# Patient Record
Sex: Male | Born: 2000 | Race: White | Hispanic: No | Marital: Single | Smoking: Never smoker
Health system: Southern US, Community
[De-identification: ages and names within clinical notes are randomized; demographics above are authoritative.]

## PROBLEM LIST (undated history)

## (undated) DIAGNOSIS — Q998 Other specified chromosome abnormalities: Secondary | ICD-10-CM

## (undated) DIAGNOSIS — F840 Autistic disorder: Secondary | ICD-10-CM

## (undated) DIAGNOSIS — J45909 Unspecified asthma, uncomplicated: Secondary | ICD-10-CM

## (undated) DIAGNOSIS — R569 Unspecified convulsions: Secondary | ICD-10-CM

## (undated) DIAGNOSIS — T7840XA Allergy, unspecified, initial encounter: Secondary | ICD-10-CM

## (undated) DIAGNOSIS — F99 Mental disorder, not otherwise specified: Secondary | ICD-10-CM

## (undated) DIAGNOSIS — Q922 Partial trisomy: Secondary | ICD-10-CM

## (undated) DIAGNOSIS — F909 Attention-deficit hyperactivity disorder, unspecified type: Secondary | ICD-10-CM

## (undated) DIAGNOSIS — F952 Tourette's disorder: Secondary | ICD-10-CM

## (undated) DIAGNOSIS — F419 Anxiety disorder, unspecified: Secondary | ICD-10-CM

## (undated) DIAGNOSIS — R51 Headache: Secondary | ICD-10-CM

## (undated) DIAGNOSIS — H539 Unspecified visual disturbance: Secondary | ICD-10-CM

## (undated) DIAGNOSIS — E669 Obesity, unspecified: Secondary | ICD-10-CM

## (undated) DIAGNOSIS — L858 Other specified epidermal thickening: Secondary | ICD-10-CM

## (undated) HISTORY — PX: EAR TUBE REMOVAL: SHX1486

---
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
## 2022-06-08 ENCOUNTER — Emergency Department
Admission: EM | Admit: 2022-06-08 | Discharge: 2022-06-08 | Disposition: A | Discharge status: Home / Self Care | Payer: Medicaid Other | Attending: Emergency Medicine | Admitting: Emergency Medicine

## 2022-06-08 ENCOUNTER — Other Ambulatory Visit: Payer: Self-pay

## 2022-06-08 DIAGNOSIS — R059 Cough, unspecified: Secondary | ICD-10-CM | POA: Insufficient documentation

## 2022-06-08 DIAGNOSIS — R42 Dizziness and giddiness: Secondary | ICD-10-CM | POA: Insufficient documentation

## 2022-06-08 DIAGNOSIS — R112 Nausea with vomiting, unspecified: Secondary | ICD-10-CM | POA: Diagnosis present

## 2022-06-08 LAB — URINALYSIS, ROUTINE W REFLEX MICROSCOPIC
Bilirubin Urine: NEGATIVE
Glucose, UA: NEGATIVE mg/dL
Hgb urine dipstick: NEGATIVE
Ketones, ur: NEGATIVE mg/dL
Leukocytes,Ua: NEGATIVE
Nitrite: NEGATIVE
Protein, ur: 30 mg/dL — AB
Specific Gravity, Urine: 1.028 (ref 1.005–1.030)
pH: 6 (ref 5.0–8.0)

## 2022-06-08 LAB — CBC
HCT: 43.2 % (ref 39.0–52.0)
Hemoglobin: 14.1 g/dL (ref 13.0–17.0)
MCH: 27.5 pg (ref 26.0–34.0)
MCHC: 32.6 g/dL (ref 30.0–36.0)
MCV: 84.4 fL (ref 80.0–100.0)
Platelets: 361 10*3/uL (ref 150–400)
RBC: 5.12 MIL/uL (ref 4.22–5.81)
RDW: 14.2 % (ref 11.5–15.5)
WBC: 6.5 10*3/uL (ref 4.0–10.5)
nRBC: 0 % (ref 0.0–0.2)

## 2022-06-08 LAB — BASIC METABOLIC PANEL
Anion gap: 11 (ref 5–15)
BUN: 15 mg/dL (ref 6–20)
CO2: 24 mmol/L (ref 22–32)
Calcium: 9.5 mg/dL (ref 8.9–10.3)
Chloride: 100 mmol/L (ref 98–111)
Creatinine, Ser: 0.86 mg/dL (ref 0.61–1.24)
GFR, Estimated: >60 mL/min (ref >60)
Glucose, Bld: 102 mg/dL — ABNORMAL HIGH (ref 70–99)
Potassium: 4.1 mmol/L (ref 3.5–5.1)
Sodium: 135 mmol/L (ref 135–145)

## 2022-06-08 LAB — CBG MONITORING, ED: Glucose-Capillary: 100 mg/dL — ABNORMAL HIGH (ref 70–99)

## 2022-06-08 NOTE — ED Triage Notes (Signed)
EMS called to [facility] for nose bleed.  Arrives to [facility], state that bleeding had resolved upon arrival.  Patient presents for  c/o dizziness.  VS wnl.

## 2022-06-08 NOTE — ED Notes (Signed)
Spoke to [employer] at group home, ride is confirmed.

## 2022-06-08 NOTE — ED Provider Notes (Signed)
[facility] Provider Note    Event Date/Time   First MD Initiated Contact with Patient 06/08/22 1135     (approximate)   History   Nausea and dizziness   HPI  Steven Aguilar is a 22 y.o. male with a history of autism who presents for evaluation after an episode of nausea and dizziness.  He reports that he felt nauseated after having a bloody nose, he coughed up a bloody clot and felt nauseated and slightly dizzy.  This lasted approximately 1 minute, he feels much better, he did call EMS for this.  No palpitations no other complaints at this time     Physical Exam   Triage Vital Signs: ED Triage Vitals  Enc Vitals Group     BP 06/08/22 1125 (!) 128/97     Pulse Rate 06/08/22 1125 87     Resp 06/08/22 1125 18     Temp 06/08/22 1125 98.4 F (36.9 C)     Temp Source 06/08/22 1125 Oral     SpO2 06/08/22 1125 100 %     Weight 06/08/22 1106 104.3 kg (229 lb 15 oz)     Height 06/08/22 1106 1.778 m (5\' 10" )     Head Circumference --      Peak Flow --      Pain Score 06/08/22 1106 0     Pain Loc --      Pain Edu? --      Excl. in [place]? --     Most recent vital signs: Vitals:   06/08/22 1125  BP: (!) 128/97  Pulse: 87  Resp: 18  Temp: 98.4 F (36.9 C)  SpO2: 100%     General: Awake, no distress.  CV:  Good peripheral perfusion.  Regular rate and rhythm, no murmur Resp:  Normal effort.  Abd:  No distention.  Other:     ED Results / Procedures / Treatments   Labs (all labs ordered are listed, but only abnormal results are displayed) Labs Reviewed  BASIC METABOLIC PANEL - Abnormal; Notable for the following components:      Result Value   Glucose, Bld 102 (*)    All other components within normal limits  URINALYSIS, ROUTINE W REFLEX MICROSCOPIC - Abnormal; Notable for the following components:   Color, Urine YELLOW (*)    APPearance HAZY (*)    Protein, ur 30 (*)    Bacteria, UA RARE (*)    All other components within normal  limits  CBG MONITORING, ED - Abnormal; Notable for the following components:   Glucose-Capillary 100 (*)    All other components within normal limits  CBC     EKG ED ECG REPORT I, Lavonia Drafts, the attending physician, personally viewed and interpreted this ECG.  Date: 06/08/2022  Rhythm: normal sinus rhythm QRS Axis: normal Intervals: Nonspecific changes ST/T Wave abnormalities: normal Narrative Interpretation: no evidence of acute ischemia     RADIOLOGY     PROCEDURES:  Critical Care performed:   Procedures   MEDICATIONS ORDERED IN ED: Medications - No data to display   IMPRESSION / MDM / [place] / ED COURSE  I reviewed the triage vital signs and the nursing notes. Patient's presentation is most consistent with acute illness / injury with system symptoms.  Patient presents with nausea and dizziness as detailed above, I suspect this was a reaction to coughing up blood clot from epistaxis.  Lab work reviewed and is reassuring, normal platelets, normal  hemoglobin, he is asymptomatic and well-appearing at this time, no indication for further workup        FINAL CLINICAL IMPRESSION(S) / ED DIAGNOSES   Final diagnoses:  Nausea and vomiting, unspecified vomiting type     Rx / DC Orders   ED Discharge Orders     None        Note:  This document was prepared using Dragon voice recognition software and may include unintentional dictation errors.   Lavonia Drafts, MD 06/08/22 1440

## 2022-11-14 ENCOUNTER — Emergency Department
Admission: EM | Admit: 2022-11-14 | Discharge: 2022-11-15 | Disposition: A | Discharge status: Home / Self Care | Payer: MEDICAID | Attending: Emergency Medicine | Admitting: Emergency Medicine

## 2022-11-14 ENCOUNTER — Other Ambulatory Visit: Payer: Self-pay

## 2022-11-14 DIAGNOSIS — F840 Autistic disorder: Secondary | ICD-10-CM | POA: Diagnosis not present

## 2022-11-14 DIAGNOSIS — J45909 Unspecified asthma, uncomplicated: Secondary | ICD-10-CM | POA: Insufficient documentation

## 2022-11-14 DIAGNOSIS — F902 Attention-deficit hyperactivity disorder, combined type: Secondary | ICD-10-CM | POA: Diagnosis not present

## 2022-11-14 DIAGNOSIS — F4310 Post-traumatic stress disorder, unspecified: Secondary | ICD-10-CM | POA: Insufficient documentation

## 2022-11-14 DIAGNOSIS — R45850 Homicidal ideations: Secondary | ICD-10-CM | POA: Diagnosis not present

## 2022-11-14 DIAGNOSIS — F849 Pervasive developmental disorder, unspecified: Secondary | ICD-10-CM | POA: Diagnosis not present

## 2022-11-14 DIAGNOSIS — R4689 Other symptoms and signs involving appearance and behavior: Secondary | ICD-10-CM | POA: Diagnosis present

## 2022-11-14 LAB — URINE DRUG SCREEN, QUALITATIVE (ARMC ONLY)
Amphetamines, Ur Screen: NOT DETECTED
Barbiturates, Ur Screen: NOT DETECTED
Benzodiazepine, Ur Scrn: NOT DETECTED
Cannabinoid 50 Ng, Ur ~~LOC~~: NOT DETECTED
Cocaine Metabolite,Ur ~~LOC~~: NOT DETECTED
MDMA (Ecstasy)Ur Screen: NOT DETECTED
Methadone Scn, Ur: NOT DETECTED
Opiate, Ur Screen: NOT DETECTED
Phencyclidine (PCP) Ur S: NOT DETECTED
Tricyclic, Ur Screen: NOT DETECTED

## 2022-11-14 LAB — COMPREHENSIVE METABOLIC PANEL
ALT: 40 U/L (ref 0–44)
AST: 23 U/L (ref 15–41)
Albumin: 4.9 g/dL (ref 3.5–5.0)
Alkaline Phosphatase: 66 U/L (ref 38–126)
Anion gap: 10 (ref 5–15)
BUN: 16 mg/dL (ref 6–20)
CO2: 24 mmol/L (ref 22–32)
Calcium: 9.6 mg/dL (ref 8.9–10.3)
Chloride: 100 mmol/L (ref 98–111)
Creatinine, Ser: 0.97 mg/dL (ref 0.61–1.24)
GFR, Estimated: >60 mL/min (ref >60)
Glucose, Bld: 96 mg/dL (ref 70–99)
Potassium: 3.8 mmol/L (ref 3.5–5.1)
Sodium: 134 mmol/L — ABNORMAL LOW (ref 135–145)
Total Bilirubin: 0.7 mg/dL (ref 0.3–1.2)
Total Protein: 9.1 g/dL — ABNORMAL HIGH (ref 6.5–8.1)

## 2022-11-14 LAB — CBC
HCT: 43.9 % (ref 39.0–52.0)
Hemoglobin: 14 g/dL (ref 13.0–17.0)
MCH: 27.6 pg (ref 26.0–34.0)
MCHC: 31.9 g/dL (ref 30.0–36.0)
MCV: 86.4 fL (ref 80.0–100.0)
Platelets: 352 10*3/uL (ref 150–400)
RBC: 5.08 MIL/uL (ref 4.22–5.81)
RDW: 14.6 % (ref 11.5–15.5)
WBC: 7.9 10*3/uL (ref 4.0–10.5)
nRBC: 0 % (ref 0.0–0.2)

## 2022-11-14 LAB — SALICYLATE LEVEL: Salicylate Lvl: <7 mg/dL — ABNORMAL LOW (ref 7.0–30.0)

## 2022-11-14 LAB — ACETAMINOPHEN LEVEL: Acetaminophen (Tylenol), Serum: <10 ug/mL — ABNORMAL LOW (ref 10–30)

## 2022-11-14 LAB — ETHANOL: Alcohol, Ethyl (B): <10 mg/dL (ref <10)

## 2022-11-14 MED ORDER — LORAZEPAM 1 MG PO TABS: 1.0000 mg | ORAL_TABLET | ORAL | Status: DC | PRN

## 2022-11-14 MED ORDER — LORAZEPAM 1 MG PO TABS
1.0000 mg | ORAL_TABLET | Freq: Once | ORAL | Status: AC
  Administered 2022-11-14: 1 mg via ORAL
  Filled 2022-11-14: qty 1

## 2022-11-14 NOTE — BH Assessment (Signed)
Comprehensive Clinical Assessment (CCA) Screening, Triage and Referral Note  11/14/2022 Steven Aguilar [id number] recommended pt. be observed overnight and discharged back to the group home in the morning.  [name] is a 22 y.o., Caucasian, Not Hispanic or Latino ethnicity, ENGLISH speaking male with a history of ADHD, PDD, ODD, MDD with psychotic features, anxiety, autism, and chronic motor tic who presented to the ED under IVC.  Per triage note: Pt arrives with BPD with IVC papers from pt group home, Just in time. Per Respondent, pt is dx with autism and was calling his previous employer and threatening to kill all of the. Pt is very talkative in triage with staff and calm.  Since arrival to the ED, the patient has been anxious but cooperative. Pt presented with a anxious mood and a congruent affect. Pt was engaged, but fixated on returning to the group home throughout the assessment. Pt's speech was clear and coherent. Pt was able to identify why he'd presented to the ED and admitted that he threatened people from his old job at [employer] to Go because they won't give him his job back. Pt had lacking insight and judgment. Pt presented with relevant thought processes and normal psychomotor activity. The patient denied current SI, HI or AV/H. BAL/UDS is unremarkable. Chief Complaint:  Chief Complaint  Patient presents with   Homicidal   Visit Diagnosis: Homicidal ideation Active Problems:   Behavior concern   PTSD (post-traumatic stress disorder)   ADHD (attention deficit hyperactivity disorder), combined type   PDD (pervasive developmental disorder), active   Patient Reported Information How did you hear about [place]? No data recorded What Is the Reason for Your Visit/Call Today? No data recorded How Long Has This Been Causing You Problems? No data recorded What Do You Feel Would Help You the Most Today? No data recorded  Have You Recently Had Any Thoughts About Hurting Yourself?  No data recorded Are You Planning to Commit Suicide/Harm Yourself At This time? No data recorded  Have you Recently Had Thoughts About Hurting Someone [name]? No data recorded Are You Planning to Harm Someone at This Time? No data recorded Explanation: No data recorded  Have You Used Any Alcohol or Drugs in the Past 24 Hours? No data recorded How Long Ago Did You Use Drugs or Alcohol? No data recorded What Did You Use and How Much? No data recorded  Do You Currently Have a Therapist/Psychiatrist? No data recorded Name of Therapist/Psychiatrist: No data recorded  Have You Been Recently Discharged From Any Office Practice or Programs? No data recorded Explanation of Discharge From Practice/Program: No data recorded   CCA Screening Triage Referral Assessment Type of Contact: No data recorded Telemedicine Service Delivery:   Is this Initial or Reassessment?   Date Telepsych consult ordered in [facility]:    Time Telepsych consult ordered in [facility]:    Location of Assessment: No data recorded Provider Location: No data recorded   Collateral Involvement: No data recorded  Does Patient Have a Court Appointed Legal Guardian? No data recorded Name and Contact of Legal Guardian: No data recorded If Minor and Not Living with Parent(s), Who has Custody? No data recorded Is CPS involved or ever been involved? No data recorded Is APS involved or ever been involved? No data recorded  Patient Determined To Be At Risk for Harm To Self or Others Based on Review of Patient Reported Information or Presenting Complaint? No data recorded Method: No data recorded Availability of Means: No data  recorded Intent: No data recorded Notification Required: No data recorded Additional Information for Danger to Others Potential: No data recorded Additional Comments for Danger to Others Potential: No data recorded Are There Guns or Other Weapons in Your Home? No data recorded Types of Guns/Weapons: No data  recorded Are These Weapons Safely Secured?                            No data recorded Who Could Verify You Are Able To Have These Secured: No data recorded Do You Have any Outstanding Charges, Pending Court Dates, Parole/Probation? No data recorded Contacted To Inform of Risk of Harm To Self or Others: No data recorded  Does Patient Present under Involuntary Commitment? No data recorded   [place] of Residence: No data recorded  Patient Currently Receiving the Following Services: No data recorded  Determination of Need: No data recorded  Options For Referral: No data recorded  Discharge Disposition:     Aleenah Homen R Izabela Ow, LCAS

## 2022-11-14 NOTE — ED Notes (Signed)
Pt. To BHU from ED ambulatory without difficulty, to room  BHU 3. Pt. Is alert and oriented, warm and dry in no distress. Pt. Denies SI, HI, and AVH. Pt. Calm and cooperative. Pt states I am going home here soon. Pt. Made aware of security cameras and Q15 minute rounds. Pt. Encouraged to let Nursing staff know of any concerns or needs.    ENVIRONMENTAL ASSESSMENT Potentially harmful objects out of patient reach: Yes.   Personal belongings secured: Yes.   Patient dressed in hospital provided attire only: Yes.   Plastic bags out of patient reach: Yes.   Patient care equipment (cords, cables, call bells, lines, and drains) shortened, removed, or accounted for: Yes.   Equipment and supplies removed from bottom of stretcher: Yes.   Potentially toxic materials out of patient reach: Yes.   Sharps container removed or out of patient reach: Yes.

## 2022-11-14 NOTE — ED Notes (Signed)
Patient complaining of anxiety and states, "I got to get out of here."

## 2022-11-14 NOTE — Consult Note (Signed)
Telepsych Consultation   Reason for Consult:  psych evaluation Referring Physician:  Dr. Larinda Buttery Location of Patient:  Location of Provider: Other: Remote   Patient Identification: Steven Aguilar MRN:  [id number] Principal Diagnosis: Homicidal ideation Diagnosis:  Principal Problem:   Homicidal ideation Active Problems:   Behavior concern   PTSD (post-traumatic stress disorder)   ADHD (attention deficit hyperactivity disorder), combined type   PDD (pervasive developmental disorder), active   Total Time spent with patient: 45 minutes  Subjective:   Steven Aguilar is a 22 y.o. male patient admitted with threats to his former employee.  HPI:  Tele psych Assessment Patient location:  [facility] ER Provider location:  Remote office  [name], 22 y.o., male patient seen via tele health by TTS and this provider; chart reviewed and consulted with Dr. Larinda Buttery on 11/14/22. Per triage note,  Pt arrives with BPD with IVC papers from pt group home, Just in time. Per Respondent, pt is dx with autism and was calling his previous employer and threatening to kill all of the. Pt is very talkative in triage with staff and calm.    During evaluation Steven Aguilar is Laying in bed he is alert/oriented x 4; anxious/cooperative; and mood congruent with affect.   [name] reports he was angry and wanted his job back at the [employer].  When they wouldn't re-employ him, he called them and threatened to kill them.  He admits that he said that out of anger and is currently expressing remorse having said that.  He states that he has no access to weapons or anything that can cause harm to another.   Patient is speaking in a clear tone at moderate volume, and normal pace; with good eye contact. His thought process is coherent and relevant; There is no indication that he is currently responding to internal/external stimuli or experiencing delusional thought content.   Patient denies suicidal/self-harm/homicidal ideation, psychosis, and paranoia. Patient is anxious but has remained calm throughout assessment and has answered questions appropriately.     Recommendations: Discharge back to group home in the am if patient remains psychiatrically stable  Past Psychiatric History: Autism, PTSD, RDD, Chronic motor tic disorder  Risk to Self:   Risk to Others:   Prior Inpatient Therapy:   Prior Outpatient Therapy:    Past Medical History:  Past Medical History:  Diagnosis Date   ADHD (attention deficit hyperactivity disorder)    Allergy    Anxiety    Asthma    Autism    Duplication of chromosome 7p    Headache(784.0)    Keratosis pilaris    uses eucerin at times   Mental disorder    Obesity    Seizure (HCC)    Tourette's    Vision abnormalities    reading glasses    Past Surgical History:  Procedure Laterality Date   EAR TUBE REMOVAL     Family History: No family history on file. Family Psychiatric  History:  Social History:  Social History   Substance and Sexual Activity  Alcohol Use Never     Social History   Substance and Sexual Activity  Drug Use No    Social History   Socioeconomic History   Marital status: Single    Spouse name: Not on file   Number of children: Not on file   Years of education: Not on file   Highest education level: Not on file  Occupational History   Occupation: Consulting civil engineer  Comment: [employer], [employer]  Tobacco Use   Smoking status: Never   Smokeless tobacco: Never  Vaping Use   Vaping status: Never Used  Substance and Sexual Activity   Alcohol use: Never   Drug use: No   Sexual activity: Never  Other Topics Concern   Not on file  Social History Narrative   ** Merged History Encounter **            Social Determinants of Health   Financial Resource Strain: Not on file  Food Insecurity: Not on file  Transportation Needs: Not on file  Physical Activity: Not on file  Stress: Not on  file  Social Connections: Not on file   Additional Social History:    Allergies:   Allergies  Allergen Reactions   Latuda [Lurasidone Hcl] Other (See Comments)    Pts grandparents report increase in symptoms, stiffness of joints, and loss of balance.      Labs:  Results for orders placed or performed during the hospital encounter of 11/14/22 (from the past 48 hour(s))  Urine Drug Screen, Qualitative     Status: None   Collection Time: 11/14/22  3:57 PM  Result Value Ref Range   Tricyclic, Ur Screen NONE DETECTED NONE DETECTED   Amphetamines, Ur Screen NONE DETECTED NONE DETECTED   MDMA (Ecstasy)Ur Screen NONE DETECTED NONE DETECTED   Cocaine Metabolite,Ur [place] NONE DETECTED NONE DETECTED   Opiate, Ur Screen NONE DETECTED NONE DETECTED   Phencyclidine (PCP) Ur S NONE DETECTED NONE DETECTED   Cannabinoid 50 Ng, Ur [place] NONE DETECTED NONE DETECTED   Barbiturates, Ur Screen NONE DETECTED NONE DETECTED   Benzodiazepine, Ur Scrn NONE DETECTED NONE DETECTED   Methadone Scn, Ur NONE DETECTED NONE DETECTED    Comment: (NOTE) Tricyclics + metabolites, urine    Cutoff 1000 ng/mL Amphetamines + metabolites, urine  Cutoff 1000 ng/mL MDMA (Ecstasy), urine              Cutoff 500 ng/mL Cocaine Metabolite, urine          Cutoff 300 ng/mL Opiate + metabolites, urine        Cutoff 300 ng/mL Phencyclidine (PCP), urine         Cutoff 25 ng/mL Cannabinoid, urine                 Cutoff 50 ng/mL Barbiturates + metabolites, urine  Cutoff 200 ng/mL Benzodiazepine, urine              Cutoff 200 ng/mL Methadone, urine                   Cutoff 300 ng/mL  The urine drug screen provides only a preliminary, unconfirmed analytical test result and should not be used for non-medical purposes. Clinical consideration and professional judgment should be applied to any positive drug screen result due to possible interfering substances. A more specific alternate chemical method must be used in order to obtain a  confirmed analytical result. Gas chromatography / mass spectrometry (GC/MS) is the preferred confirm atory method. Performed at [facility], [address] [place], [place] [address]   Comprehensive metabolic panel     Status: Abnormal   Collection Time: 11/14/22  3:58 PM  Result Value Ref Range   Sodium 134 (L) 135 - 145 mmol/L   Potassium 3.8 3.5 - 5.1 mmol/L   Chloride 100 98 - 111 mmol/L   CO2 24 22 - 32 mmol/L   Glucose, Bld 96 70 -  99 mg/dL    Comment: Glucose reference range applies only to samples taken after fasting for at least 8 hours.   BUN 16 6 - 20 mg/dL   Creatinine, Ser 9.51 0.61 - 1.24 mg/dL   Calcium 9.6 8.9 - 88.4 mg/dL   Total Protein 9.1 (H) 6.5 - 8.1 g/dL   Albumin 4.9 3.5 - 5.0 g/dL   AST 23 15 - 41 U/L   ALT 40 0 - 44 U/L   Alkaline Phosphatase 66 38 - 126 U/L   Total Bilirubin 0.7 0.3 - 1.2 mg/dL   GFR, Estimated >16 >60 mL/min    Comment: (NOTE) Calculated using the CKD-EPI Creatinine Equation (2021)    Anion gap 10 5 - 15    Comment: Performed at [facility], [address] [place], [place] [address]  Ethanol     Status: None   Collection Time: 11/14/22  3:58 PM  Result Value Ref Range   Alcohol, Ethyl (B) <10 <10 mg/dL    Comment: (NOTE) Lowest detectable limit for serum alcohol is 10 mg/dL.  For medical purposes only. Performed at [facility], [address] [place], [place] [address]   Salicylate level     Status: Abnormal   Collection Time: 11/14/22  3:58 PM  Result Value Ref Range   Salicylate Lvl <7.0 (L) 7.0 - 30.0 mg/dL    Comment: Performed at [facility], [address] [place], [place] [address]  Acetaminophen level     Status: Abnormal   Collection Time: 11/14/22  3:58 PM  Result Value Ref Range   Acetaminophen (Tylenol), Serum <10 (L) 10 - 30 ug/mL    Comment: (NOTE) Therapeutic concentrations vary significantly. A range of 10-30 ug/mL  may be an effective concentration for many  patients. However, some  are best treated at concentrations outside of this range. Acetaminophen concentrations >150 ug/mL at 4 hours after ingestion  and >50 ug/mL at 12 hours after ingestion are often associated with  toxic reactions.  Performed at [facility], [address] [place], [place] [address]   cbc     Status: None   Collection Time: 11/14/22  3:58 PM  Result Value Ref Range   WBC 7.9 4.0 - 10.5 K/uL   RBC 5.08 4.22 - 5.81 MIL/uL   Hemoglobin 14.0 13.0 - 17.0 g/dL   HCT 54.2 70.6 - 23.7 %   MCV 86.4 80.0 - 100.0 fL   MCH 27.6 26.0 - 34.0 pg   MCHC 31.9 30.0 - 36.0 g/dL   RDW 62.8 31.5 - 17.6 %   Platelets 352 150 - 400 K/uL   nRBC 0.0 0.0 - 0.2 %    Comment: Performed at [facility], [address] [place], [place] [address]    Medications:  Current Facility-Administered Medications  Medication Dose Route Frequency Provider Last Rate Last Admin   LORazepam (ATIVAN) tablet 1 mg  1 mg Oral Q4H PRN Jearld Lesch, NP       Or   LORazepam (ATIVAN) tablet 1 mg  1 mg Oral Q4H PRN Jearld Lesch, NP       Current Outpatient Medications  Medication Sig Dispense Refill   albuterol (PROVENTIL HFA;VENTOLIN HFA) 108 (90 BASE) MCG/ACT inhaler Inhale 2 puffs into the lungs every 4 (four) hours as needed for wheezing or shortness of breath (or cough).     cariprazine (VRAYLAR) capsule Take 1.5 mg by mouth daily.     cetirizine (ZYRTEC) 10  MG tablet Take 10 mg by mouth daily.     Cholecalciferol 125 MCG (5000 UT) TABS Take 5,000 Units by mouth daily.     haloperidol (HALDOL) 5 MG tablet Take 5 mg by mouth 3 (three) times daily.     hydrOXYzine (ATARAX/VISTARIL) 25 MG tablet Take 25-50 mg by mouth daily as needed for anxiety.     ketoconazole (NIZORAL) 2 % cream Apply 1 application topically 2 (two) times daily.     lamoTRIgine (LAMICTAL) 200 MG tablet Take 200 mg by mouth 3 (three) times daily.     metFORMIN (GLUCOPHAGE) 850 MG tablet Take 850 mg by  mouth daily with breakfast.     Omega-3 Fatty Acids (FISH OIL) 500 MG CAPS Take 1,000 mg by mouth 2 (two) times daily.     propranolol ER (INDERAL LA) 60 MG 24 hr capsule Take 60 mg by mouth daily.     Skin Protectants, Misc. (EUCERIN) cream Apply 1 application topically as needed (after showers).       Musculoskeletal: Strength & Muscle Tone: within normal limits Gait & Station: normal Patient leans: N/A  Psychiatric Specialty Exam:  Presentation  General Appearance: Appropriate for Environment  Eye Contact:Fair  Speech:Clear and Coherent  Speech Volume:Normal  Handedness:Right   Mood and Affect  Mood:Anxious  Affect:Appropriate; Congruent   Thought Process  Thought Processes:Coherent  Descriptions of Associations:Intact  Orientation:Full (Time, Place and Person)  Thought Content:Tangential  History of Schizophrenia/Schizoaffective disorder:No data recorded Duration of Psychotic Symptoms:No data recorded Hallucinations:Hallucinations: None  Ideas of Reference:None  Suicidal Thoughts:Suicidal Thoughts: No  Homicidal Thoughts:Homicidal Thoughts: No   Sensorium  Memory:Immediate Good; Recent Good  Judgment:Fair  Insight:Fair   Executive Functions  Concentration:Good  Attention Span:Good  Recall:Good  Fund of Knowledge:Good  Language:Good   Psychomotor Activity  Psychomotor Activity:Psychomotor Activity: Normal   Assets  Assets:Desire for Improvement; Manufacturing systems engineer; Financial Resources/Insurance; Housing   Sleep  Sleep:Sleep: Good    Physical Exam: Physical Exam Vitals and nursing note reviewed.  Constitutional:      Appearance: Normal appearance.  HENT:     Head: Normocephalic and atraumatic.     Nose: Nose normal.  Eyes:     Pupils: Pupils are equal, round, and reactive to light.  Pulmonary:     Effort: Pulmonary effort is normal.  Musculoskeletal:        General: Normal range of motion.     Cervical back: Normal  range of motion.  Skin:    General: Skin is dry.  Neurological:     Mental Status: He is alert and oriented to person, place, and time.    Review of Systems  Psychiatric/Behavioral:  Negative for depression, hallucinations, memory loss, substance abuse and suicidal ideas. The patient is not nervous/anxious and does not have insomnia.   All other systems reviewed and are negative.  Blood pressure (!) 119/95, pulse 79, temperature 98.4 F (36.9 C), temperature source Oral, resp. rate 18, height 6\' 1"  (1.854 m), weight 108.9 kg, SpO2 97%. Body mass index is 31.66 kg/m.  Treatment Plan Summary: Daily contact with patient to assess and evaluate symptoms and progress in treatment and Discharge in the am to grouphome if patient remains psychiatrically stable.  Disposition: No evidence of imminent risk to self or others at present.   Patient does not meet criteria for psychiatric inpatient admission. Supportive therapy provided about ongoing stressors. Discussed crisis plan, support from social network, calling 911, coming to the Emergency Department, and calling Suicide Hotline.  This  service was provided via telemedicine using a 2-way, interactive audio and Immunologist.  Names of all persons participating in this telemedicine service and their role in this encounter.   Jearld Lesch, NP 11/14/2022 11:54 PM

## 2022-11-14 NOTE — ED Provider Notes (Signed)
[facility] Provider Note    Event Date/Time   First MD Initiated Contact with Patient 11/14/22 1636     (approximate)   History   Chief Complaint Homicidal   HPI  Steven Aguilar is a 22 y.o. male with past medical history of autistic spectrum disorder, depression, Tourette's syndrome, ODD, and PTSD who presents to the ED for homicidal ideation.  Patient reportedly was acting out at his group home earlier today and made a call to his ex coworkers when he threatened to kill multiple of them.  He was subsequently placed under IVC and brought to the ED for further evaluation.  He now denies this and is asking when he can go back to his group home.  He currently denies any complaints, states he has been taking medications as prescribed.     Physical Exam   Triage Vital Signs: ED Triage Vitals [11/14/22 1555]  Encounter Vitals Group     BP (!) 145/92     Systolic BP Percentile      Diastolic BP Percentile      Pulse Rate 91     Resp 17     Temp 98 F (36.7 C)     Temp Source Oral     SpO2 97 %     Weight 240 lb (108.9 kg)     Height 6\' 1"  (1.854 m)     Head Circumference      Peak Flow      Pain Score 0     Pain Loc      Pain Education      Exclude from Growth Chart     Most recent vital signs: Vitals:   11/14/22 1555  BP: (!) 145/92  Pulse: 91  Resp: 17  Temp: 98 F (36.7 C)  SpO2: 97%    Constitutional: Alert and oriented. Eyes: Conjunctivae are normal. Head: Atraumatic. Nose: No congestion/rhinnorhea. Mouth/Throat: Mucous membranes are moist.  Cardiovascular: Normal rate, regular rhythm. Grossly normal heart sounds.  2+ radial pulses bilaterally. Respiratory: Normal respiratory effort.  No retractions. Lungs CTAB. Gastrointestinal: Soft and nontender. No distention. Musculoskeletal: No lower extremity tenderness nor edema.  Neurologic:  Normal speech and language. No gross focal neurologic deficits are  appreciated.    ED Results / Procedures / Treatments   Labs (all labs ordered are listed, but only abnormal results are displayed) Labs Reviewed  COMPREHENSIVE METABOLIC PANEL - Abnormal; Notable for the following components:      Result Value   Sodium 134 (*)    Total Protein 9.1 (*)    All other components within normal limits  SALICYLATE LEVEL - Abnormal; Notable for the following components:   Salicylate Lvl <7.0 (*)    All other components within normal limits  ACETAMINOPHEN LEVEL - Abnormal; Notable for the following components:   Acetaminophen (Tylenol), Serum <10 (*)    All other components within normal limits  ETHANOL  CBC  URINE DRUG SCREEN, QUALITATIVE ([facility] ONLY)    PROCEDURES:  Critical Care performed: No  Procedures   MEDICATIONS ORDERED IN ED: Medications - No data to display   IMPRESSION / MDM / ASSESSMENT AND PLAN / ED COURSE  I reviewed the triage vital signs and the nursing notes.                              23 y.o. male with past medical history of autistic spectrum disorder,  depression, Tourette's, ODD, PTSD who presents to the ED for psychiatric evaluation after he made multiple homicidal threats.  Patient's presentation is most consistent with acute presentation with potential threat to life or bodily function.  Differential diagnosis includes, but is not limited to, homicidal ideation, suicidal ideation, medication noncompliance, depression, anxiety, bipolar disorder, psychosis.  Patient nontoxic-appearing and in no acute distress, vital signs are unremarkable.  He denies any medical complaints and screening labs are unremarkable with no significant anemia, leukocytosis, showed abnormality, or AKI.  Tylenol and salicylate levels are undetectable, patient may be medically cleared for psychiatric disposition.  Psych eval pending at this time, patient currently calm and cooperative.  The patient has been placed in psychiatric observation due to  the need to provide a safe environment for the patient while obtaining psychiatric consultation and evaluation, as well as ongoing medical and medication management to treat the patient's condition.  The patient has been placed under full IVC at this time.       FINAL CLINICAL IMPRESSION(S) / ED DIAGNOSES   Final diagnoses:  Homicidal ideation  Autistic spectrum disorder     Rx / DC Orders   ED Discharge Orders     None        Note:  This document was prepared using Dragon voice recognition software and may include unintentional dictation errors.   Chesley Noon, MD 11/14/22 1714

## 2022-11-14 NOTE — ED Triage Notes (Signed)
Pt arrives with BPD with IVC papers from pt group home, [facility]. Per Respondent, pt is dx with autism and was calling his previous employer and threatening to kill all of the. Pt is very talkative in triage with staff and calm.

## 2022-11-14 NOTE — ED Notes (Signed)
IVC AWAITING TTS/PSYCH CONSULT

## 2022-11-14 NOTE — ED Notes (Signed)
EDP Dr. Larinda Buttery made aware of patient having anxiety. Order for PO ativan received. Patient took PO medication with no issues.

## 2022-11-14 NOTE — ED Notes (Signed)
Pt changed into psych safe clothing. RN placed pt brown/grey colored shoes, black and purple shorts, and a white tee shirt with pt gray underwear in belongings bag with pt cell phone and charging cable.

## 2022-11-15 DIAGNOSIS — R45850 Homicidal ideations: Secondary | ICD-10-CM | POA: Diagnosis not present

## 2022-11-15 MED ORDER — HYDROXYZINE HCL 25 MG PO TABS: 50.0000 mg | ORAL_TABLET | Freq: Three times a day (TID) | ORAL | Status: DC | PRN

## 2022-11-15 MED ORDER — ACETAMINOPHEN 325 MG PO TABS
650.0000 mg | ORAL_TABLET | Freq: Once | ORAL | Status: AC
  Administered 2022-11-15: 650 mg via ORAL
  Filled 2022-11-15: qty 2

## 2022-11-15 NOTE — ED Provider Notes (Signed)
Psychiatry team has cleared the patient for discharge, they are overturning IVC   Jene Every, MD 11/15/22 1154

## 2022-11-15 NOTE — BH Assessment (Signed)
Received call from Jonnie Finner, owner of patient's group home. She states she will send staff member to come pick up patient and if we could have patient in lobby that would be helpful. Writer informed ED staff.

## 2022-11-15 NOTE — Consult Note (Signed)
[facility] Face-to-Face Psychiatry Consult  Reassessment  Reason for Consult:  HI Referring Physician:  Chesley Noon, MD Patient Identification: Steven Aguilar MRN:  [id number] Principal Diagnosis: Homicidal ideation Diagnosis:  Principal Problem:   Homicidal ideation Active Problems:   Behavior concern   PTSD (post-traumatic stress disorder)   Attention deficit hyperactivity disorder (ADHD), combined type   PDD (pervasive developmental disorder)   Total Time spent with patient: 30 minutes  Subjective:   Steven Aguilar is a 22 y.o. male patient admitted with "I got into an argument with [employer]".  HPI:   Patient seen face to face by this provider, consulted with emergency department physician Dr. Cyril Loosen; and chart reviewed on 11/15/22. Steven Aguilar, 22 y.o., male with a past psychiatric history of autism spectrum disorder, ADHD, anxiety and Tourette's syndrome was brought into [facility] ED by law enforcement under Involuntary commitment (IVC) from his care home. According to IVC document: Respondent is diagnosed with autism. Today he is highly agitated and called his old job multiple times and threatened to kill the employees."   On evaluation today, Steven Aguilar reports having an argument with his previous employer at [employer].  He expresses remorse for his actions and acknowledges his poor judgment at that time.  He currently denies access to weapons and admits that he made the threats out of anger.  He has identified potential corrective actions for future situations, such as calling the crisis hotline, listening to music, or playing his PlayStation 5.  Patient resides at [facility] care home. He reports feeling safe in the care home and expresses a desire to return there as soon as possible, citing "the group home is better than this place." Patient says he is currently connected with outpatient psychiatry and therapy services through [facility] where  he sees Dr. Modesto Charon. He reports he has an upcoming appointment to see Dr. Modesto Charon on August 1st to discuss if his home visits will be reinstated. He reports seeing his therapist yesterday via tele visit. He states that he is compliant with medications. Patient denies illicit substance and alcohol use.  BAL and UDS unremarkable. PDMP Review revealed 0 active prescription.    During the evaluation Steven Aguilar is seated in his room on side of the bed in no acute distress. He is alert, oriented x 4, pleasant, anxious though cooperative. Speech is clear and coherent. Objectively there is no evidence of psychosis/mania or delusional thinking. Patient is able to converse coherently, goal directed thoughts, no distractibility, or pre-occupation. He also denies suicidal/self-harm/homicidal ideation, psychosis, and paranoia. He demonstrates improved insight and judgment. Patient remained calm and answered questions appropriately.    Per TTS Steven Aguilar, Counselor:  Received call from Steven Aguilar, owner of patient's group home. She states she will send staff member to come pick up patient and if we could have patient in lobby that would be helpful. Writer informed ED staff.    Per Steven Aguilar assessment 11/14/22:  Steven Aguilar, 22 y.o., male patient seen via tele health by TTS and this provider; chart reviewed and consulted with Dr. Larinda Buttery on 11/14/22. Per triage note,  Pt arrives with BPD with IVC papers from [facility] group home, [facility]. Per Respondent, pt is dx with autism and was calling his previous employer and threatening to kill all of the. Pt is very talkative in triage with staff and calm.     During evaluation Steven Aguilar is Laying in bed  he is alert/oriented x 4; anxious/cooperative; and mood congruent with affect.   Steven Aguilar reports he was angry and wanted his job back at the [employer].  When they wouldn't re-employ him, he called them and threatened to kill them.  He  admits that he said that out of anger and is currently expressing remorse having said that.  He states that he has no access to weapons or anything that can cause harm to another.   Patient is speaking in a clear tone at moderate volume, and normal pace; with good eye contact. His thought process is coherent and relevant; There is no indication that he is currently responding to internal/external stimuli or experiencing delusional thought content.  Patient denies suicidal/self-harm/homicidal ideation, psychosis, and paranoia. Patient is anxious but has remained calm throughout assessment and has answered questions appropriately.       Recommendations: Discharge back to group home in the am if patient remains psychiatrically stable  Past Psychiatric History:  ADHD (attention deficit hyperactivity disorder) Anxiety Autism Tourette's   Risk to Self:  No Risk to Others: No   Prior Inpatient Therapy:   Prior Outpatient Therapy:  Yes  Past Medical History:  Past Medical History:  Diagnosis Date   ADHD (attention deficit hyperactivity disorder)    Allergy    Anxiety    Asthma    Autism    Duplication of chromosome 7p    Headache(784.0)    Keratosis pilaris    uses eucerin at times   Mental disorder    Obesity    Seizure (HCC)    Tourette's    Vision abnormalities    reading glasses    Past Surgical History:  Procedure Laterality Date   EAR TUBE REMOVAL     Family History: No family history on file. Family Psychiatric  History: No family psychiatric history on file. 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, Visual merchandiser  Tobacco Use   Smoking status: Never   Smokeless tobacco: Never  Vaping Use   Vaping status: Never Used  Substance and Sexual Activity   Alcohol use: Never   Drug use: No   Sexual activity: Never  Other Topics Concern   Not on file  Social History Narrative   ** Merged History Encounter **            Social Determinants of Health   Financial Resource Strain: Not on file  Food Insecurity: Not on file  Transportation Needs: Not on file  Physical Activity: Not on file  Stress: Not on file  Social Connections: Not on file   Additional Social History:    Allergies:   Allergies  Allergen Reactions   Latuda [Lurasidone Hcl] Other (See Comments)    Pts grandparents report increase in symptoms, stiffness of joints, and loss of balance.      Labs:  Results for orders placed or performed during the hospital encounter of 11/14/22 (from the past 48 hour(s))  Urine Drug Screen, Qualitative     Status: None  Collection Time: 11/14/22  3:57 PM  Result Value Ref Range   Tricyclic, Ur Screen NONE DETECTED NONE DETECTED   Amphetamines, Ur Screen NONE DETECTED NONE DETECTED   MDMA (Ecstasy)Ur Screen NONE DETECTED NONE DETECTED   Cocaine Metabolite,Ur [place] NONE DETECTED NONE DETECTED   Opiate, Ur Screen NONE DETECTED NONE DETECTED   Phencyclidine (PCP) Ur S NONE DETECTED NONE DETECTED   Cannabinoid 50 Ng, Ur [place] NONE DETECTED NONE DETECTED   Barbiturates, Ur Screen NONE DETECTED NONE DETECTED   Benzodiazepine, Ur Scrn NONE DETECTED NONE DETECTED   Methadone Scn, Ur NONE DETECTED NONE DETECTED    Comment: (NOTE) Tricyclics + metabolites, urine    Cutoff 1000 ng/mL Amphetamines + metabolites, urine  Cutoff 1000 ng/mL MDMA (Ecstasy), urine              Cutoff 500 ng/mL Cocaine Metabolite, urine          Cutoff 300 ng/mL Opiate + metabolites, urine        Cutoff 300 ng/mL Phencyclidine (PCP), urine         Cutoff 25 ng/mL Cannabinoid, urine                  Cutoff 50 ng/mL Barbiturates + metabolites, urine  Cutoff 200 ng/mL Benzodiazepine, urine              Cutoff 200 ng/mL Methadone, urine                   Cutoff 300 ng/mL  The urine drug screen provides only a preliminary, unconfirmed analytical test result and should not be used for non-medical purposes. Clinical consideration and professional judgment should be applied to any positive drug screen result due to possible interfering substances. A more specific alternate chemical method must be used in order to obtain a confirmed analytical result. Gas chromatography / mass spectrometry (GC/MS) is the preferred confirm atory method. Performed at [facility], [address] [place], [place] [address]   Comprehensive metabolic panel     Status: Abnormal   Collection Time: 11/14/22  3:58 PM  Result Value Ref Range   Sodium 134 (L) 135 - 145 mmol/L   Potassium 3.8 3.5 - 5.1 mmol/L   Chloride 100 98 - 111 mmol/L   CO2 24 22 - 32 mmol/L   Glucose, Bld 96 70 - 99 mg/dL    Comment: Glucose reference range applies only to samples taken after fasting for at least 8 hours.   BUN 16 6 - 20 mg/dL   Creatinine, Ser 1.91 0.61 - 1.24 mg/dL   Calcium 9.6 8.9 - 47.8 mg/dL   Total Protein 9.1 (H) 6.5 - 8.1 g/dL   Albumin 4.9 3.5 - 5.0 g/dL   AST 23 15 - 41 U/L   ALT 40 0 - 44 U/L   Alkaline Phosphatase 66 38 - 126 U/L   Total Bilirubin 0.7 0.3 - 1.2 mg/dL   GFR, Estimated >29 >56 mL/min    Comment: (NOTE) Calculated using the CKD-EPI Creatinine Equation (2021)    Anion gap 10 5 - 15    Comment: Performed at [facility], [address] [place], [place] [address]  Ethanol     Status: None   Collection Time: 11/14/22  3:58 PM  Result Value Ref Range   Alcohol, Ethyl (B) <10 <10 mg/dL    Comment: (NOTE) Lowest detectable limit for serum alcohol is 10 mg/dL.  For medical purposes only. Performed at [facility],  [address], [place], [place] [address]    Salicylate level     Status: Abnormal   Collection Time: 11/14/22  3:58 PM  Result Value Ref Range   Salicylate Lvl <7.0 (L) 7.0 - 30.0 mg/dL    Comment: Performed at [facility], [address] [place], [place] [address]  Acetaminophen level     Status: Abnormal   Collection Time: 11/14/22  3:58 PM  Result Value Ref Range   Acetaminophen (Tylenol), Serum <10 (L) 10 - 30 ug/mL    Comment: (NOTE) Therapeutic concentrations vary significantly. A range of 10-30 ug/mL  may be an effective concentration for many patients. However, some  are best treated at concentrations outside of this range. Acetaminophen concentrations >150 ug/mL at 4 hours after ingestion  and >50 ug/mL at 12 hours after ingestion are often associated with  toxic reactions.  Performed at [facility], [address] [place], [place] [address]   cbc     Status: None   Collection Time: 11/14/22  3:58 PM  Result Value Ref Range   WBC 7.9 4.0 - 10.5 K/uL   RBC 5.08 4.22 - 5.81 MIL/uL   Hemoglobin 14.0 13.0 - 17.0 g/dL   HCT 78.4 69.6 - 29.5 %   MCV 86.4 80.0 - 100.0 fL   MCH 27.6 26.0 - 34.0 pg   MCHC 31.9 30.0 - 36.0 g/dL   RDW 28.4 13.2 - 44.0 %   Platelets 352 150 - 400 K/uL   nRBC 0.0 0.0 - 0.2 %    Comment: Performed at [facility], [address], [place], [place] [address]    Current Facility-Administered Medications  Medication Dose Route Frequency Provider Last Rate Last Admin   LORazepam (ATIVAN) tablet 1 mg  1 mg Oral Q4H PRN Jearld Lesch, NP       Or   LORazepam (ATIVAN) tablet 1 mg  1 mg Oral Q4H PRN Jearld Lesch, NP       Current Outpatient Medications  Medication Sig Dispense Refill   albuterol (PROVENTIL HFA;VENTOLIN HFA) 108 (90 BASE) MCG/ACT inhaler Inhale 2 puffs into the lungs every 4 (four) hours as needed for wheezing or shortness of breath (or cough).     cariprazine (VRAYLAR) capsule Take 1.5 mg by mouth daily.     cetirizine (ZYRTEC) 10 MG  tablet Take 10 mg by mouth daily.     Cholecalciferol 125 MCG (5000 UT) TABS Take 5,000 Units by mouth daily.     haloperidol (HALDOL) 5 MG tablet Take 5 mg by mouth 3 (three) times daily.     hydrOXYzine (ATARAX/VISTARIL) 25 MG tablet Take 25-50 mg by mouth daily as needed for anxiety.     ketoconazole (NIZORAL) 2 % cream Apply 1 application topically 2 (two) times daily.     lamoTRIgine (LAMICTAL) 200 MG tablet Take 200 mg by mouth 3 (three) times daily.     metFORMIN (GLUCOPHAGE) 850 MG tablet Take 850 mg by mouth daily with breakfast.     Omega-3 Fatty Acids (FISH OIL) 500 MG CAPS Take 1,000 mg by mouth 2 (two) times daily.     propranolol ER (INDERAL LA) 60 MG 24 hr capsule Take 60 mg by mouth daily.     Skin Protectants, Misc. (EUCERIN) cream Apply 1 application topically as needed (after showers).       Musculoskeletal: Strength & Muscle Tone: within normal limits Gait & Station: normal Patient leans: N/A  Psychiatric Specialty Exam:  Presentation  General Appearance:  Appropriate for Environment  Eye Contact: Fair  Speech: Clear and Coherent  Speech Volume: Normal  Handedness: Right   Mood and Affect  Mood: Anxious  Affect: Appropriate; Congruent   Thought Process  Thought Processes: Coherent  Descriptions of Associations:Intact  Orientation:Full (Time, Place and Person)  Thought Content:Tangential  History of Schizophrenia/Schizoaffective disorder:No data recorded Duration of Psychotic Symptoms:No data recorded Hallucinations:Hallucinations: None  Ideas of Reference:None  Suicidal Thoughts:Suicidal Thoughts: No  Homicidal Thoughts:Homicidal Thoughts: No   Sensorium  Memory: Immediate Good; Recent Good  Judgment: Fair  Insight: Fair   Art therapist  Concentration: Good  Attention Span: Good  Recall: Good  Fund of Knowledge: Good  Language: Good   Psychomotor Activity  Psychomotor  Activity: Psychomotor Activity: Normal   Assets  Assets: Desire for Improvement; Communication Skills; Financial Resources/Insurance; Housing   Sleep  Sleep: Sleep: Good   Physical Exam: Physical Exam Vitals and nursing note reviewed. Exam conducted with a chaperone present.  HENT:     Head: Normocephalic.     Nose: Nose normal.  Cardiovascular:     Rate and Rhythm: Normal rate.     Pulses: Normal pulses.  Pulmonary:     Effort: Pulmonary effort is normal.  Musculoskeletal:        General: Normal range of motion.     Cervical back: Normal range of motion.  Neurological:     Mental Status: He is alert. Mental status is at baseline.    Review of Systems  Psychiatric/Behavioral:  The patient is nervous/anxious.   All other systems reviewed and are negative.  Blood pressure 111/61, pulse 78, temperature 98.2 F (36.8 C), temperature source Oral, resp. rate 18, height 6\' 1"  (1.854 m), weight 108.9 kg, SpO2 97%. Body mass index is 31.66 kg/m.  Treatment Plan Summary: Plan 22 y.o. male with a history of autism was brought into the ED under IVC for reportedly making threatening statements to his previous employer.  He currently expresses remorse, denies access to weapons, admits previous statements were made out of anger and has since demonstrated improved insight and judgment.  Patient is currently not homicidal or suicidal, nor is there evidence of acute psychosis.  Patient no longer meets IVC criteria at this time.  Patient is currently residing in a care home and is psychiatrically stable, with no safety concerns expressed by the group home staff at this time.  Plan reviewed with ED physician, Dr. Cyril Loosen.   Disposition:  No evidence of imminent risk to self or others at present.   Patient does not meet criteria for psychiatric inpatient admission. Supportive therapy provided about ongoing stressors. Discussed crisis plan, support from social network, calling 911, coming to  the Emergency Department, and calling Suicide Hotline.  Norma Fredrickson, NP 11/15/2022 9:08 AM
